# Patient Record
Sex: Female | Born: 1970 | Race: Black or African American | Hispanic: No | Marital: Married | Smoking: Never smoker
Health system: Southern US, Community
[De-identification: ages and names within clinical notes are randomized; demographics above are authoritative.]

## PROBLEM LIST (undated history)

## (undated) DIAGNOSIS — I10 Essential (primary) hypertension: Secondary | ICD-10-CM

## (undated) HISTORY — PX: CHOLECYSTECTOMY: SHX55

---

## 1997-05-12 ENCOUNTER — Inpatient Hospital Stay (HOSPITAL_COMMUNITY): Admission: AD | Admit: 1997-05-12 | Discharge: 1997-05-14 | Payer: Self-pay | Admitting: Obstetrics & Gynecology

## 1997-05-17 ENCOUNTER — Encounter: Admission: RE | Admit: 1997-05-17 | Discharge: 1997-08-15 | Payer: Self-pay | Admitting: Obstetrics & Gynecology

## 1999-07-02 ENCOUNTER — Other Ambulatory Visit: Admission: RE | Admit: 1999-07-02 | Discharge: 1999-07-02 | Payer: Self-pay | Admitting: Obstetrics & Gynecology

## 2000-05-12 ENCOUNTER — Inpatient Hospital Stay (HOSPITAL_COMMUNITY): Admission: AD | Admit: 2000-05-12 | Discharge: 2000-05-14 | Payer: Self-pay | Admitting: Family Medicine

## 2000-05-15 ENCOUNTER — Encounter: Admission: RE | Admit: 2000-05-15 | Discharge: 2000-06-14 | Payer: Self-pay | Admitting: Obstetrics and Gynecology

## 2000-06-15 ENCOUNTER — Encounter: Admission: RE | Admit: 2000-06-15 | Discharge: 2000-07-15 | Payer: Self-pay | Admitting: Obstetrics and Gynecology

## 2000-10-10 ENCOUNTER — Encounter: Admission: RE | Admit: 2000-10-10 | Discharge: 2000-11-09 | Payer: Self-pay | Admitting: Obstetrics and Gynecology

## 2000-11-10 ENCOUNTER — Encounter: Admission: RE | Admit: 2000-11-10 | Discharge: 2000-12-10 | Payer: Self-pay | Admitting: Obstetrics and Gynecology

## 2001-09-18 ENCOUNTER — Other Ambulatory Visit: Admission: RE | Admit: 2001-09-18 | Discharge: 2001-09-18 | Payer: Self-pay | Admitting: Obstetrics and Gynecology

## 2003-02-12 ENCOUNTER — Other Ambulatory Visit: Admission: RE | Admit: 2003-02-12 | Discharge: 2003-02-12 | Payer: Self-pay | Admitting: Obstetrics & Gynecology

## 2004-03-01 ENCOUNTER — Other Ambulatory Visit: Admission: RE | Admit: 2004-03-01 | Discharge: 2004-03-01 | Payer: Self-pay | Admitting: Obstetrics & Gynecology

## 2008-05-14 ENCOUNTER — Ambulatory Visit (HOSPITAL_COMMUNITY): Admission: RE | Admit: 2008-05-14 | Discharge: 2008-05-14 | Payer: Self-pay | Admitting: Internal Medicine

## 2008-08-07 ENCOUNTER — Emergency Department (HOSPITAL_COMMUNITY): Admission: EM | Admit: 2008-08-07 | Discharge: 2008-08-07 | Payer: Self-pay | Admitting: Emergency Medicine

## 2008-08-20 ENCOUNTER — Encounter: Admission: RE | Admit: 2008-08-20 | Discharge: 2008-08-20 | Payer: Self-pay | Admitting: Surgery

## 2009-11-20 ENCOUNTER — Encounter: Admission: RE | Admit: 2009-11-20 | Discharge: 2009-11-20 | Payer: Self-pay | Admitting: Obstetrics & Gynecology

## 2010-04-18 LAB — COMPREHENSIVE METABOLIC PANEL
ALT: 12 U/L (ref 0–35)
AST: 22 U/L (ref 0–37)
Albumin: 3.9 g/dL (ref 3.5–5.2)
Calcium: 9.2 mg/dL (ref 8.4–10.5)
GFR calc Af Amer: >60 mL/min (ref >60)
Sodium: 136 mEq/L (ref 135–145)
Total Protein: 7.5 g/dL (ref 6.0–8.3)

## 2010-04-18 LAB — DIFFERENTIAL
Eosinophils Absolute: 0 10*3/uL (ref 0.0–0.7)
Eosinophils Relative: 0 % (ref 0–5)
Lymphs Abs: 0.9 10*3/uL (ref 0.7–4.0)
Monocytes Relative: 5 % (ref 3–12)
Neutrophils Relative %: 86 % — ABNORMAL HIGH (ref 43–77)

## 2010-04-18 LAB — CBC
MCHC: 33.8 g/dL (ref 30.0–36.0)
Platelets: 274 10*3/uL (ref 150–400)
RDW: 14 % (ref 11.5–15.5)

## 2010-04-18 LAB — URINALYSIS, ROUTINE W REFLEX MICROSCOPIC
Ketones, ur: 40 mg/dL — AB
Nitrite: NEGATIVE
Protein, ur: NEGATIVE mg/dL
pH: 6 (ref 5.0–8.0)

## 2010-08-01 IMAGING — CT CT HEAD WO/W CM
1 of 2 series · 13 of 30 positions shown, 17 images · IV contrast (agent unspecified)
Comparison: None

CLINICAL DATA: Dizziness.  Double vision.  Decreased level of
consciousness.

CT HEAD WITHOUT AND WITH CONTRAST
TECHNIQUE: Contiguous axial images were obtained from the base of
the skull through the vertex without and with intravenous contrast.
Contrast: 100 ml [id number]

[Series 2: brain w/o · axial · non-contrast · 0.47mm/px · z∈[+143,+257]mm · 13 of 26 slices shown, 17 images]
[im 2/26  brain]
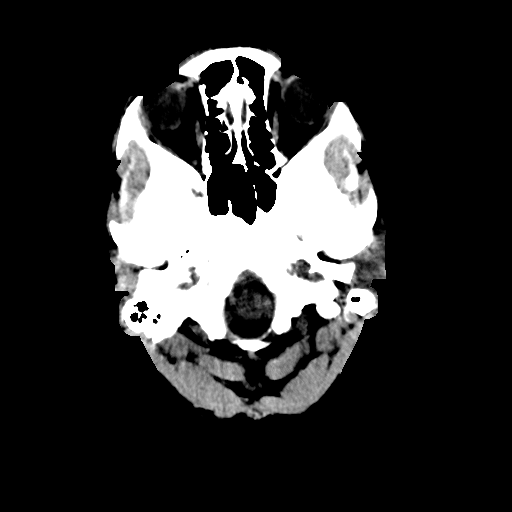
[im 2/26  bone]
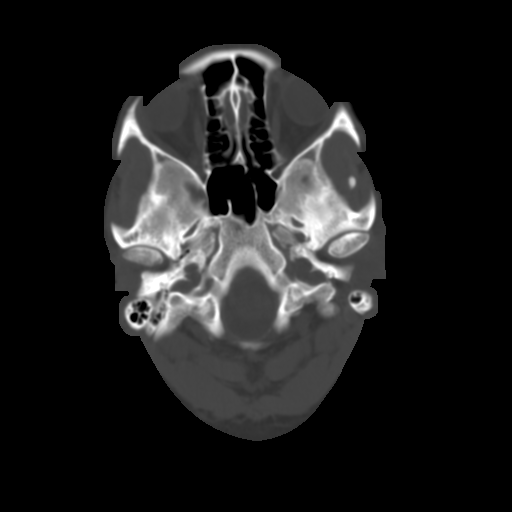
[im 4/26  brain]
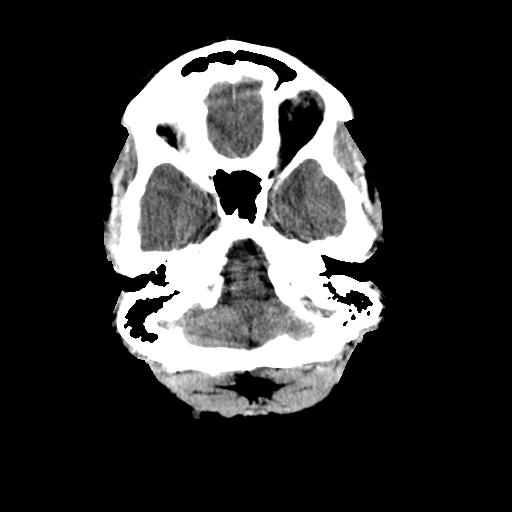
[im 6/26  brain]
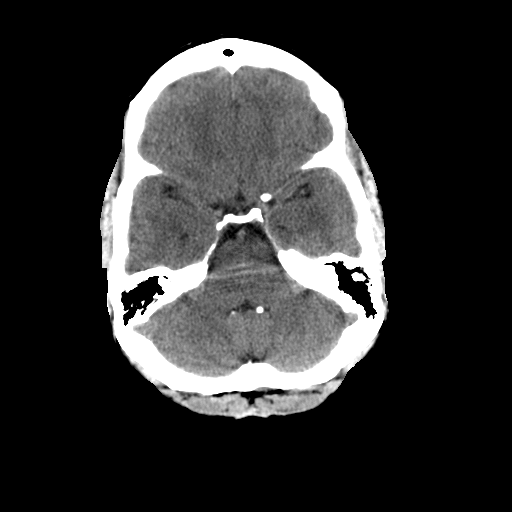
[im 8/26  brain]
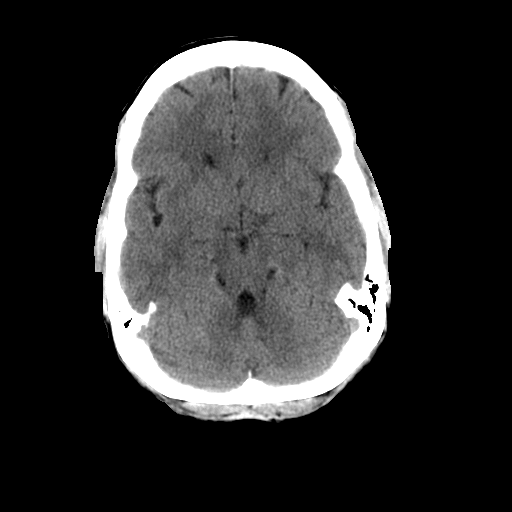
[im 9/26  brain]
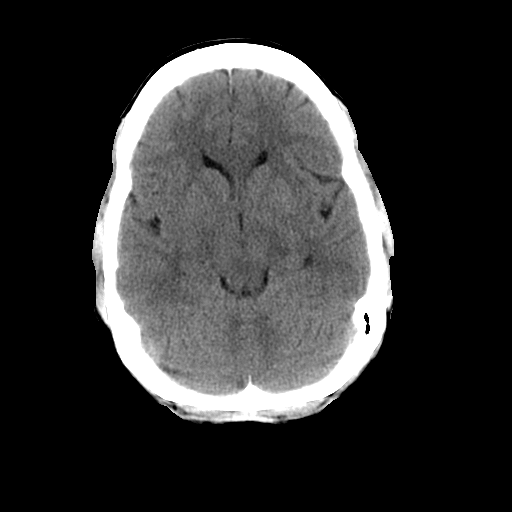
[im 9/26  bone]
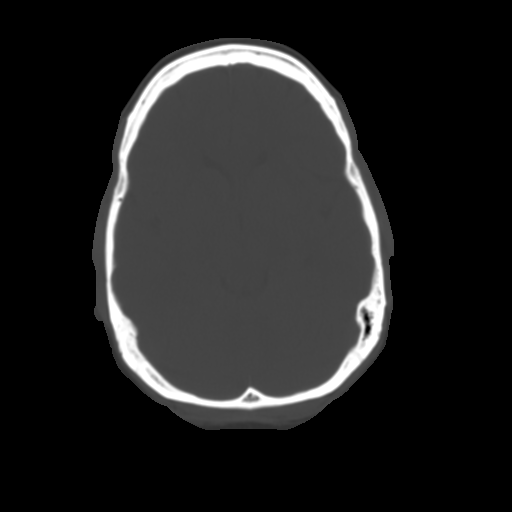
[im 11/26  brain]
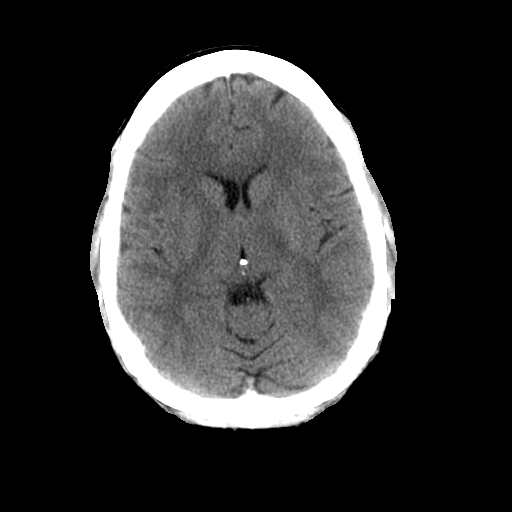
[im 13/26  brain]
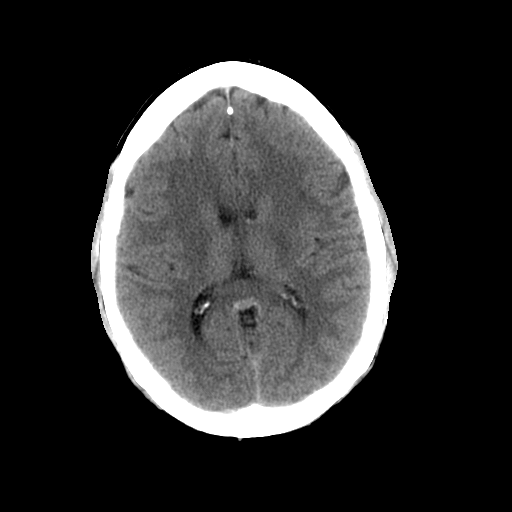
[im 15/26  brain]
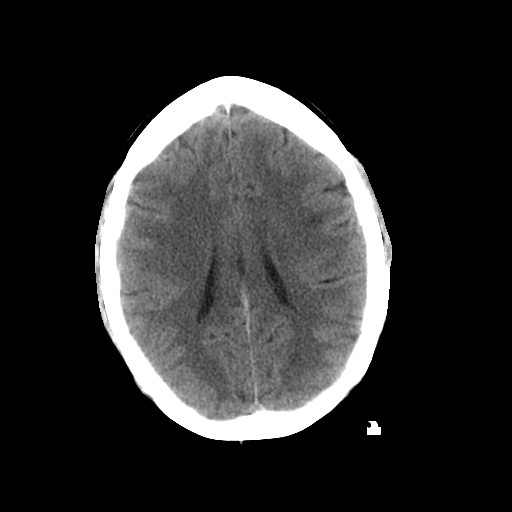
[im 17/26  brain]
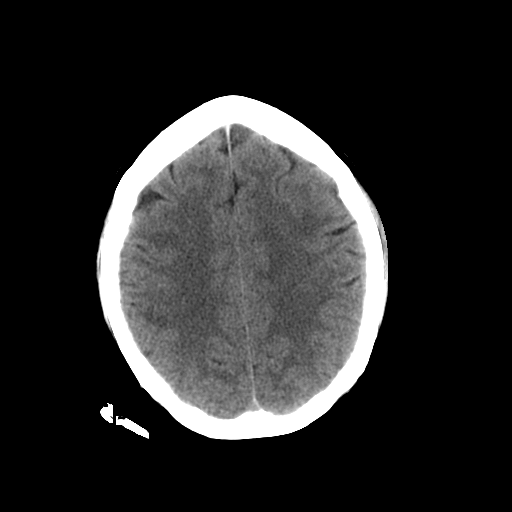
[im 17/26  bone]
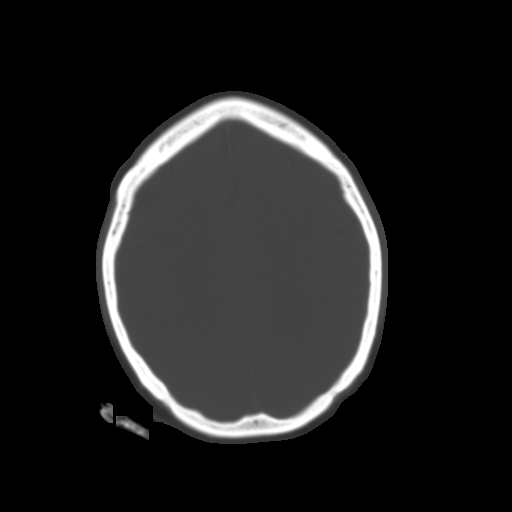
[im 18/26  brain]
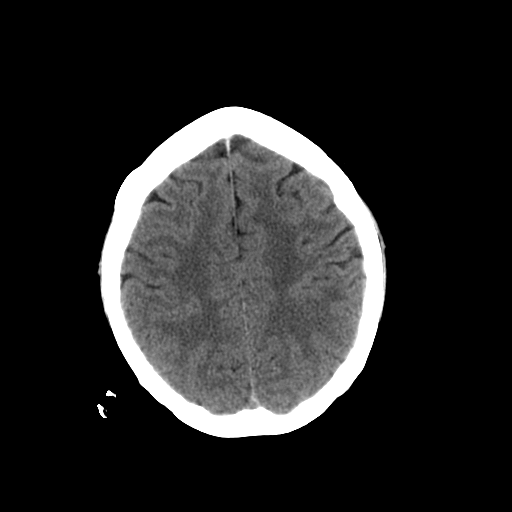
[im 20/26  brain]
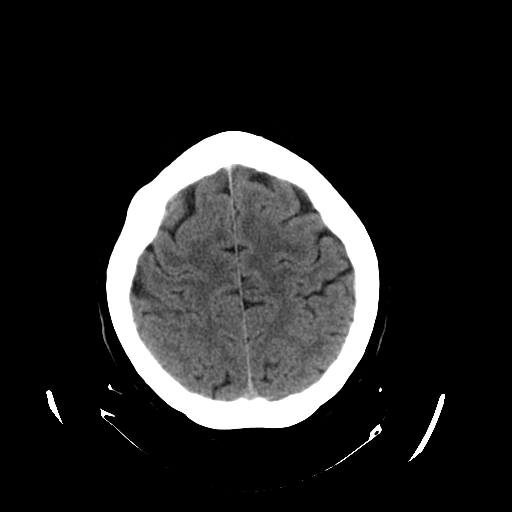
[im 22/26  brain]
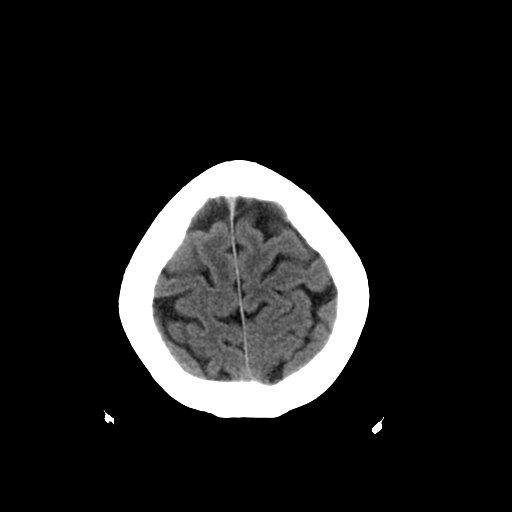
[im 24/26  brain]
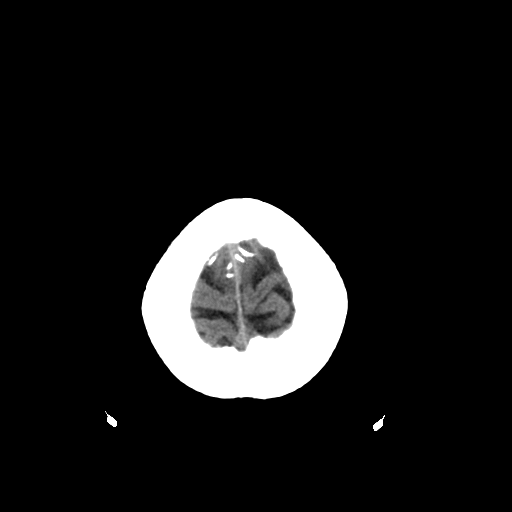
[im 24/26  bone]
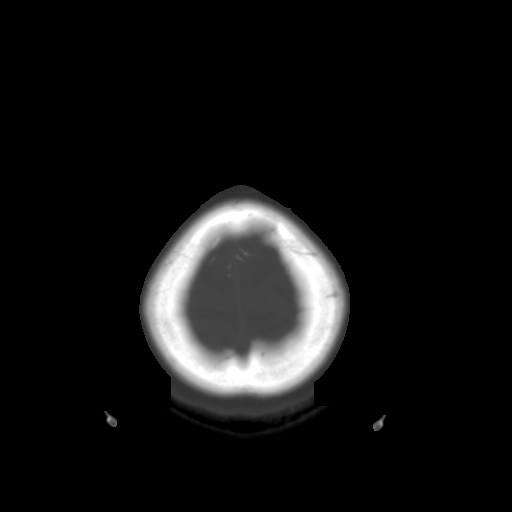

[13 of 30 positions shown; findings below may reference images not displayed]

FINDINGS: The brain has a normal appearance without evidence of
atrophy, old or acute small vessel infarction, mass lesion,
hemorrhage, hydrocephalus or extra-axial collection.  No abnormal
enhancement occurs.  No calvarial abnormality.  The paranasal
sinuses, middle ears and mastoids are clear.
IMPRESSION: Normal examination.  No cause of the patient's described symptoms
is identified.

## 2010-08-29 ENCOUNTER — Inpatient Hospital Stay (INDEPENDENT_AMBULATORY_CARE_PROVIDER_SITE_OTHER)
Admission: RE | Admit: 2010-08-29 | Discharge: 2010-08-29 | Disposition: A | Discharge status: Home / Self Care | Payer: Managed Care, Other (non HMO) | Source: Ambulatory Visit | Attending: Emergency Medicine | Admitting: Emergency Medicine

## 2010-08-29 DIAGNOSIS — B9789 Other viral agents as the cause of diseases classified elsewhere: Secondary | ICD-10-CM

## 2010-08-29 LAB — POCT RAPID STREP A: Streptococcus, Group A Screen (Direct): NEGATIVE

## 2010-10-11 ENCOUNTER — Other Ambulatory Visit: Payer: Self-pay | Admitting: Internal Medicine

## 2010-10-11 DIAGNOSIS — Z1231 Encounter for screening mammogram for malignant neoplasm of breast: Secondary | ICD-10-CM

## 2010-11-22 ENCOUNTER — Ambulatory Visit
Admission: RE | Admit: 2010-11-22 | Discharge: 2010-11-22 | Disposition: A | Discharge status: Home / Self Care | Payer: Managed Care, Other (non HMO) | Source: Ambulatory Visit | Attending: Internal Medicine | Admitting: Internal Medicine

## 2010-11-22 DIAGNOSIS — Z1231 Encounter for screening mammogram for malignant neoplasm of breast: Secondary | ICD-10-CM

## 2011-05-20 ENCOUNTER — Encounter (HOSPITAL_COMMUNITY): Payer: Self-pay | Admitting: *Deleted

## 2011-05-20 ENCOUNTER — Emergency Department (HOSPITAL_COMMUNITY)
Admission: EM | Admit: 2011-05-20 | Discharge: 2011-05-21 | Disposition: A | Discharge status: Home / Self Care | Payer: Managed Care, Other (non HMO) | Attending: Emergency Medicine | Admitting: Emergency Medicine

## 2011-05-20 DIAGNOSIS — M549 Dorsalgia, unspecified: Secondary | ICD-10-CM | POA: Insufficient documentation

## 2011-05-20 DIAGNOSIS — I10 Essential (primary) hypertension: Secondary | ICD-10-CM

## 2011-05-20 DIAGNOSIS — R079 Chest pain, unspecified: Secondary | ICD-10-CM | POA: Insufficient documentation

## 2011-05-20 DIAGNOSIS — R0789 Other chest pain: Secondary | ICD-10-CM

## 2011-05-20 HISTORY — DX: Essential (primary) hypertension: I10

## 2011-05-20 NOTE — ED Notes (Signed)
Pt c/o CP and back pain that increases with inspiration.  Denies n/v, SOB, dizziness, diaphoresis.

## 2011-05-21 ENCOUNTER — Emergency Department (HOSPITAL_COMMUNITY): Payer: Managed Care, Other (non HMO)

## 2011-05-21 LAB — CBC
HCT: 38.2 % (ref 36.0–46.0)
Hemoglobin: 13.1 g/dL (ref 12.0–15.0)
MCH: 27.7 pg (ref 26.0–34.0)
MCHC: 34.3 g/dL (ref 30.0–36.0)
MCV: 80.8 fL (ref 78.0–100.0)
Platelets: 275 10*3/uL (ref 150–400)
RBC: 4.73 MIL/uL (ref 3.87–5.11)
RDW: 13.8 % (ref 11.5–15.5)
WBC: 7.7 10*3/uL (ref 4.0–10.5)

## 2011-05-21 LAB — BASIC METABOLIC PANEL
CO2: 27 mEq/L (ref 19–32)
Chloride: 102 mEq/L (ref 96–112)
Creatinine, Ser: 0.79 mg/dL (ref 0.50–1.10)

## 2011-05-21 LAB — CARDIAC PANEL(CRET KIN+CKTOT+MB+TROPI)
CK, MB: 1.5 ng/mL (ref 0.3–4.0)
Relative Index: 1 (ref 0.0–2.5)
Total CK: 145 U/L (ref 7–177)
Troponin I: <0.3 ng/mL

## 2011-05-21 LAB — POCT I-STAT TROPONIN I: Troponin i, poc: 0 ng/mL (ref 0.00–0.08)

## 2011-05-21 LAB — D-DIMER, QUANTITATIVE: D-Dimer, Quant: 0.33 ug{FEU}/mL (ref 0.00–0.48)

## 2011-05-21 MED ORDER — HYDROCODONE-ACETAMINOPHEN 7.5-500 MG/15ML PO SOLN: 15.0000 mL | Freq: Four times a day (QID) | ORAL | Status: AC | PRN

## 2011-05-21 MED ORDER — MORPHINE SULFATE 4 MG/ML IJ SOLN
4.0000 mg | Freq: Once | INTRAMUSCULAR | Status: DC
  Filled 2011-05-21: qty 1

## 2011-05-21 MED ORDER — HYDROCODONE-ACETAMINOPHEN 7.5-500 MG/15ML PO SOLN
10.0000 mL | Freq: Once | ORAL | Status: AC
  Administered 2011-05-21: 10 mL via ORAL
  Filled 2011-05-21: qty 15

## 2011-05-21 NOTE — Discharge Instructions (Signed)
Your workup today in the emergency department has not shown a specific cause for your pain. Please followup with your primary care Dr. in 2-3 days for recheck. Over the weekend, rest. Return to the emergency room for worsening symptoms or new concerning symptoms.  Chest Pain (Nonspecific) It is often hard to give a specific diagnosis for the cause of chest pain. There is always a chance that your pain could be related to something serious, such as a heart attack or a blood clot in the lungs. You need to follow up with your caregiver for further evaluation. CAUSES   Heartburn.   Pneumonia or bronchitis.   Anxiety or stress.   Inflammation around your heart (pericarditis) or lung (pleuritis or pleurisy).   A blood clot in the lung.   A collapsed lung (pneumothorax). It can develop suddenly on its own (spontaneous pneumothorax) or from injury (trauma) to the chest.   Shingles infection (herpes zoster virus).  The chest wall is composed of bones, muscles, and cartilage. Any of these can be the source of the pain.  The bones can be bruised by injury.   The muscles or cartilage can be strained by coughing or overwork.   The cartilage can be affected by inflammation and become sore (costochondritis).  DIAGNOSIS  Lab tests or other studies, such as X-rays, electrocardiography, stress testing, or cardiac imaging, may be needed to find the cause of your pain.  TREATMENT   Treatment depends on what may be causing your chest pain. Treatment may include:   Acid blockers for heartburn.   Anti-inflammatory medicine.   Pain medicine for inflammatory conditions.   Antibiotics if an infection is present.   You may be advised to change lifestyle habits. This includes stopping smoking and avoiding alcohol, caffeine, and chocolate.   You may be advised to keep your head raised (elevated) when sleeping. This reduces the chance of acid going backward from your stomach into your esophagus.   Most  of the time, nonspecific chest pain will improve within 2 to 3 days with rest and mild pain medicine.  HOME CARE INSTRUCTIONS   If antibiotics were prescribed, take your antibiotics as directed. Finish them even if you start to feel better.   For the next few days, avoid physical activities that bring on chest pain. Continue physical activities as directed.   Do not smoke.   Avoid drinking alcohol.   Only take over-the-counter or prescription medicine for pain, discomfort, or fever as directed by your caregiver.   Follow your caregiver's suggestions for further testing if your chest pain does not go away.   Keep any follow-up appointments you made. If you do not go to an appointment, you could develop lasting (chronic) problems with pain. If there is any problem keeping an appointment, you must call to reschedule.  SEEK MEDICAL CARE IF:   You think you are having problems from the medicine you are taking. Read your medicine instructions carefully.   Your chest pain does not go away, even after treatment.   You develop a rash with blisters on your chest.  SEEK IMMEDIATE MEDICAL CARE IF:   You have increased chest pain or pain that spreads to your arm, neck, jaw, back, or abdomen.   You develop shortness of breath, an increasing cough, or you are coughing up blood.   You have severe back or abdominal pain, feel nauseous, or vomit.   You develop severe weakness, fainting, or chills.   You have a fever.  THIS IS AN EMERGENCY. Do not wait to see if the pain will go away. Get medical help at once. Call your local emergency services (911 in [place] Do not drive yourself to the hospital. MAKE SURE YOU:   Understand these instructions.   Will watch your condition.   Will get help right away if you are not doing well or get worse.  Document Released: 10/06/2004 Document Revised: 12/16/2010 Document Reviewed: 08/02/2007 [facility] Patient Information 2012 [place], [place].  Pain of  Unknown Etiology (Pain Without a Known Cause) You have come to your caregiver because of pain. Pain can occur in any part of the body. Often there is not a definite cause. If your laboratory (blood or urine) work was normal and x-rays or other studies were normal, your caregiver may treat you without knowing the cause of the pain. An example of this is the headache. Most headaches are diagnosed by taking a history. This means your caregiver asks you questions about your headaches. Your caregiver determines a treatment based on your answers. Usually testing done for headaches is normal. Often testing is not done unless there is no response to medications. Regardless of where your pain is located today, you can be given medications to make you comfortable. If no physical cause of pain can be found, most cases of pain will gradually leave as suddenly as they came.  If you have a painful condition and no reason can be found for the pain, It is importantthat you follow up with your caregiver. If the pain becomes worse or does not go away, it may be necessary to repeat tests and look further for a possible cause.  Only take over-the-counter or prescription medicines for pain, discomfort, or fever as directed by your caregiver.   For the protection of your privacy, test results can not be given over the phone. Make sure you receive the results of your test. Ask as to how these results are to be obtained if you have not been informed. It is your responsibility to obtain your test results.   You may continue all activities unless the activities cause more pain. When the pain lessens, it is important to gradually resume normal activities. Resume activities by beginning slowly and gradually increasing the intensity and duration of the activities or exercise. During periods of severe pain, bed-rest may be helpful. Lay or sit in any position that is comfortable.   Ice used for acute (sudden) conditions may be  effective. Use a large plastic bag filled with ice and wrapped in a towel. This may provide pain relief.   See your caregiver for continued problems. They can help or refer you for exercises or physical therapy if necessary.  If you were given medications for your condition, do not drive, operate machinery or power tools, or sign legal documents for 24 hours. Do not drink alcohol, take sleeping pills, or take other medications that may interfere with treatment. See your caregiver immediately if you have pain that is becoming worse and not relieved by medications. Document Released: 09/21/2000 Document Revised: 12/16/2010 Document Reviewed: 12/27/2004 [facility] Patient Information 2012 [place], [place].  How to Take Your Blood Pressure  These instructions are only for electronic home blood pressure machines. You will need:   An automatic or semi-automatic blood pressure machine.   Fresh batteries for the blood pressure machine.  HOW DO I USE THESE TOOLS TO CHECK MY BLOOD PRESSURE?   There are 2 numbers that make up your blood pressure. For  example: 120/80.   The first number (120 in our example) is called the "systolic pressure." It is a measure of the pressure in your blood vessels when your heart is pumping blood.   The second number (80 in our example) is called the "diastolic pressure." It is a measure of the pressure in your blood vessels when your heart is resting between beats.   Before you buy a home blood pressure machine, check the size of your arm so you can buy the right size cuff. Here is how to check the size of your arm:   Use a tape measure that shows both inches and centimeters.   Wrap the tape measure around the middle upper part of your arm. You may need someone to help you measure right.   Write down your arm measurement in both inches and centimeters.   To measure your blood pressure right, it is important to have the right size cuff.   If your arm is up to 13 inches  (37 to 34 centimeters), get an adult cuff size.   If your arm is 13 to 17 inches (35 to 44 centimeters), get a large adult cuff size.   If your arm is 17 to 20 inches (45 to 52 centimeters), get an adult thigh cuff.   Try to rest or relax for at least 30 minutes before you check your blood pressure.   Do not smoke.   Do not have any drinks with caffeine, such as:   Pop.   Coffee.   Tea.   Check your blood pressure in a quiet room.   Sit down and stretch out your arm on a table. Keep your arm at about the level of your heart. Let your arm relax.  GETTING BLOOD PRESSURE READINGS  Make sure you remove any tight-fighting clothing from your arm. Wrap the cuff around your upper arm. Wrap it just above the bend, and above where you felt the pulse. You should be able to slip a finger between the cuff and your arm. If you cannot slip a finger in the cuff, it is too tight and should be removed and rewrapped.   Some units requires you to manually pump up the arm cuff.   Automatic units inflate the cuff when you press a button.   Cuff deflation is automatic in both models.   After the cuff is inflated, the unit measures your blood pressure and pulse. The readings are displayed on a monitor. Hold still and breathe normally while the cuff is inflated.   Getting a reading takes less than a minute.   Some models store readings in a memory. Some provide a printout of readings.   Get readings at different times of the day. You should wait at least 5 minutes between readings. Take readings with you to your next doctor's visit.  Document Released: 12/10/2007 Document Revised: 12/16/2010 Document Reviewed: 12/10/2007 [facility] Patient Information 2012 [place], [place].

## 2011-05-21 NOTE — ED Notes (Signed)
Patient with chest pain, started earlier today, does have some shortness of breath with exertion.  Patient denies any nausea or vomiting at this time.

## 2011-05-23 NOTE — ED Provider Notes (Signed)
History     CSN: [id number]  Arrival date & time 05/20/11  2210   First MD Initiated Contact with Patient 05/21/11 0136      Chief Complaint  Patient presents with  . Chest Pain    (Consider location/radiation/quality/duration/timing/severity/associated sxs/prior treatment) HPI 41 year old female presents emergency department with complaint of chest pain and back pain. Patient reports onset of pain today after going to a baseball game. Patient describes the pain as a tightness and tenderness, and "feeling like gas". Pain is worse with movement and with deep breathing. Patient has noted that when she coughs or sneezes the pain is worse. Patient denies nausea vomiting diaphoresis leg swelling. Patient is not on birth control. She is not a smoker. She has no family or personal history of PE or DVT. No family history of coronary disease. Patient with history of hypertension, but does not take any medication for it   Past Medical History  Diagnosis Date  . Hypertension     Past Surgical History  Procedure Date  . Cholecystectomy     History reviewed. No pertinent family history.  History  Substance Use Topics  . Smoking status: Never Smoker   . Smokeless tobacco: Not on file  . Alcohol Use: No    OB History    Grav Para Term Preterm Abortions TAB SAB Ect Mult Living                  Review of Systems  All other systems reviewed and are negative.    Allergies  Review of patient's allergies indicates no known allergies.  Home Medications   Current Outpatient Rx  Name Route Sig Dispense Refill  . CALCIUM CARBONATE ANTACID 500 MG PO CHEW Oral Chew 1 tablet by mouth daily.    Marland Kitchen OMEPRAZOLE 20 MG PO CPDR Oral Take 20 mg by mouth daily.    Marland Kitchen HYDROCODONE-ACETAMINOPHEN 7.5-500 MG/15ML PO SOLN Oral Take 15 mLs by mouth every 6 (six) hours as needed for pain. 120 mL 0    BP 155/99  Pulse 77  Temp(Src) 98.4 F (36.9 C) (Oral)  Resp 18  SpO2 100%  LMP  04/15/2011  Physical Exam  Nursing note and vitals reviewed. Constitutional: She is oriented to person, place, and time. She appears well-developed and well-nourished.  HENT:  Head: Normocephalic and atraumatic.  Nose: Nose normal.  Mouth/Throat: Oropharynx is clear and moist.  Eyes: Conjunctivae and EOM are normal. Pupils are equal, round, and reactive to light.  Neck: Normal range of motion. Neck supple. No JVD present. No tracheal deviation present. No thyromegaly present.  Cardiovascular: Normal rate, regular rhythm, normal heart sounds and intact distal pulses.  Exam reveals no gallop and no friction rub.   No murmur heard. Pulmonary/Chest: Effort normal and breath sounds normal. No stridor. No respiratory distress. She has no wheezes. She has no rales. She exhibits tenderness (patient with diffuse tenderness to palpation across her chest).  Abdominal: Soft. Bowel sounds are normal. She exhibits no distension and no mass. There is no tenderness. There is no rebound and no guarding.  Musculoskeletal: Normal range of motion. She exhibits no edema and no tenderness.  Lymphadenopathy:    She has no cervical adenopathy.  Neurological: She is oriented to person, place, and time. She exhibits normal muscle tone. Coordination normal.  Skin: Skin is dry. No rash noted. No erythema. No pallor.  Psychiatric: She has a normal mood and affect. Her behavior is normal. Judgment and thought content normal.  ED Course  Procedures (including critical care time)   Date: 05/20/2011  Rate:71  Rhythm: normal sinus rhythm  QRS Axis: normal  Intervals: normal  ST/T Wave abnormalities: normal  Conduction Disutrbances:none  Narrative Interpretation:   Old EKG Reviewed: none available  Results for orders placed during the hospital encounter of 05/20/11  POCT I-STAT TROPONIN I      Component Value Range   Troponin i, poc 0.00  0.00 - 0.08 (ng/mL)   Comment 3           CBC      Component Value  Range   WBC 7.7  4.0 - 10.5 (K/uL)   RBC 4.73  3.87 - 5.11 (MIL/uL)   Hemoglobin 13.1  12.0 - 15.0 (g/dL)   HCT 29.5  28.4 - 13.2 (%)   MCV 80.8  78.0 - 100.0 (fL)   MCH 27.7  26.0 - 34.0 (pg)   MCHC 34.3  30.0 - 36.0 (g/dL)   RDW 44.0  10.2 - 72.5 (%)   Platelets 275  150 - 400 (K/uL)  BASIC METABOLIC PANEL      Component Value Range   Sodium 140  135 - 145 (mEq/L)   Potassium 3.6  3.5 - 5.1 (mEq/L)   Chloride 102  96 - 112 (mEq/L)   CO2 27  19 - 32 (mEq/L)   Glucose, Bld 93  70 - 99 (mg/dL)   BUN 9  6 - 23 (mg/dL)   Creatinine, Ser 3.66  0.50 - 1.10 (mg/dL)   Calcium 9.4  8.4 - 44.0 (mg/dL)   GFR calc non Af Amer >90  >90 (mL/min)   GFR calc Af Amer >90  >90 (mL/min)  D-DIMER, QUANTITATIVE      Component Value Range   D-Dimer, Quant 0.33  0.00 - 0.48 (ug/mL-FEU)  CARDIAC PANEL(CRET KIN+CKTOT+MB+TROPI)      Component Value Range   Total CK 145  7 - 177 (U/L)   CK, MB 1.5  0.3 - 4.0 (ng/mL)   Troponin I <0.30  <0.30 (ng/mL)   Relative Index 1.0  0.0 - 2.5    Results for orders placed during the hospital encounter of 05/20/11  POCT I-STAT TROPONIN I      Component Value Range   Troponin i, poc 0.00  0.00 - 0.08 (ng/mL)   Comment 3           CBC      Component Value Range   WBC 7.7  4.0 - 10.5 (K/uL)   RBC 4.73  3.87 - 5.11 (MIL/uL)   Hemoglobin 13.1  12.0 - 15.0 (g/dL)   HCT 34.7  42.5 - 95.6 (%)   MCV 80.8  78.0 - 100.0 (fL)   MCH 27.7  26.0 - 34.0 (pg)   MCHC 34.3  30.0 - 36.0 (g/dL)   RDW 38.7  56.4 - 33.2 (%)   Platelets 275  150 - 400 (K/uL)  BASIC METABOLIC PANEL      Component Value Range   Sodium 140  135 - 145 (mEq/L)   Potassium 3.6  3.5 - 5.1 (mEq/L)   Chloride 102  96 - 112 (mEq/L)   CO2 27  19 - 32 (mEq/L)   Glucose, Bld 93  70 - 99 (mg/dL)   BUN 9  6 - 23 (mg/dL)   Creatinine, Ser 9.51  0.50 - 1.10 (mg/dL)   Calcium 9.4  8.4 - 88.4 (mg/dL)   GFR calc non Af Amer >90  >90 (mL/min)  GFR calc Af Amer >90  >90 (mL/min)  D-DIMER, QUANTITATIVE       Component Value Range   D-Dimer, Quant 0.33  0.00 - 0.48 (ug/mL-FEU)  CARDIAC PANEL(CRET KIN+CKTOT+MB+TROPI)      Component Value Range   Total CK 145  7 - 177 (U/L)   CK, MB 1.5  0.3 - 4.0 (ng/mL)   Troponin I <0.30  <0.30 (ng/mL)   Relative Index 1.0  0.0 - 2.5    Dg Chest 2 View  05/21/2011  *RADIOLOGY REPORT*  Clinical Data: Chest pain since yesterday.  CHEST - 2 VIEW  Comparison: 08/20/2008  Findings: Slightly shallow inspiration.  Normal heart size and pulmonary vascularity.  No focal airspace consolidation in the lungs.  No blunting of costophrenic angles.  No pneumothorax. Surgical clips in the right upper quadrant.  No acute changes since the previous study.  IMPRESSION: No evidence of active pulmonary disease.  Original Report Authenticated By: Marlon Pel, M.D.      1. Chest pain, atypical   2. Hypertension       MDM  41 year old female with chest pain. Workup unremarkable. Pain seems more musculoskeletal in origin, do not feel as do 2 ACS or PE or other life-threatening cause. Patient has local. Primary care doctor, will have her followup with them this week        Olivia Mackie, MD 05/23/11 [phone]

## 2013-03-29 ENCOUNTER — Other Ambulatory Visit: Payer: Self-pay

## 2013-03-29 DIAGNOSIS — Z1231 Encounter for screening mammogram for malignant neoplasm of breast: Secondary | ICD-10-CM

## 2013-04-12 ENCOUNTER — Ambulatory Visit
Admission: RE | Admit: 2013-04-12 | Discharge: 2013-04-12 | Disposition: A | Discharge status: Home / Self Care | Payer: Managed Care, Other (non HMO) | Source: Ambulatory Visit

## 2013-04-12 DIAGNOSIS — Z1231 Encounter for screening mammogram for malignant neoplasm of breast: Secondary | ICD-10-CM

## 2016-03-25 ENCOUNTER — Encounter (HOSPITAL_COMMUNITY): Payer: Self-pay

## 2016-03-25 ENCOUNTER — Emergency Department (HOSPITAL_COMMUNITY)
Admission: EM | Admit: 2016-03-25 | Discharge: 2016-03-25 | Disposition: A | Discharge status: Home / Self Care | Payer: BLUE CROSS/BLUE SHIELD | Attending: Emergency Medicine | Admitting: Emergency Medicine

## 2016-03-25 ENCOUNTER — Emergency Department (HOSPITAL_COMMUNITY): Payer: BLUE CROSS/BLUE SHIELD

## 2016-03-25 DIAGNOSIS — R0789 Other chest pain: Secondary | ICD-10-CM | POA: Diagnosis not present

## 2016-03-25 DIAGNOSIS — I10 Essential (primary) hypertension: Secondary | ICD-10-CM | POA: Insufficient documentation

## 2016-03-25 DIAGNOSIS — R079 Chest pain, unspecified: Secondary | ICD-10-CM | POA: Diagnosis present

## 2016-03-25 LAB — BASIC METABOLIC PANEL
Anion gap: 10 (ref 5–15)
BUN: 13 mg/dL (ref 6–20)
CALCIUM: 8.8 mg/dL — AB (ref 8.9–10.3)
CO2: 24 mmol/L (ref 22–32)
Chloride: 107 mmol/L (ref 101–111)
Creatinine, Ser: 0.75 mg/dL (ref 0.44–1.00)
GFR calc Af Amer: >60 mL/min (ref >60)
Glucose, Bld: 102 mg/dL — ABNORMAL HIGH (ref 65–99)
POTASSIUM: 3.7 mmol/L (ref 3.5–5.1)
SODIUM: 141 mmol/L (ref 135–145)

## 2016-03-25 LAB — I-STAT TROPONIN, ED
TROPONIN I, POC: 0 ng/mL (ref 0.00–0.08)
TROPONIN I, POC: 0 ng/mL (ref 0.00–0.08)

## 2016-03-25 LAB — CBC
HEMATOCRIT: 36.3 % (ref 36.0–46.0)
Hemoglobin: 11.6 g/dL — ABNORMAL LOW (ref 12.0–15.0)
MCH: 26.4 pg (ref 26.0–34.0)
MCHC: 32 g/dL (ref 30.0–36.0)
MCV: 82.7 fL (ref 78.0–100.0)
PLATELETS: 290 10*3/uL (ref 150–400)
RBC: 4.39 MIL/uL (ref 3.87–5.11)
RDW: 15.1 % (ref 11.5–15.5)
WBC: 6.1 10*3/uL (ref 4.0–10.5)

## 2016-03-25 MED ORDER — KETOROLAC TROMETHAMINE 60 MG/2ML IM SOLN
60.0000 mg | Freq: Once | INTRAMUSCULAR | Status: AC
  Administered 2016-03-25: 60 mg via INTRAMUSCULAR
  Filled 2016-03-25: qty 2

## 2016-03-25 NOTE — ED Provider Notes (Signed)
[facility]-EMERGENCY DEPT Provider Note   CSN: [id number] Arrival date & time: 03/25/16  [id number]     History   Chief Complaint Chief Complaint  Patient presents with  . Chest Pain  . Shortness of Breath    HPI Vanessa Dickerson is a 46 y.o. female.  Patient is a 46 year old female with a history of hypertension who presents with chest pain. She states that 2 days ago she had some pains which she felt were gas pains in the left side of her chest and today she woke up with pain in her right chest. She feels like it's an achy-like pain. She says it hurts when she moves around. Hurts a little bit when she breathes but denies any shortness of breath. No nausea or vomiting. No cough or chest congestion. No injury to the chest. She denies any leg pain or swelling. No fevers. No prior history of heart disease. No family history of heart disease. Denies any smoking history. No recent immobilization. No exogenous estrogen use      Past Medical History:  Diagnosis Date  . Hypertension     There are no active problems to display for this patient.   Past Surgical History:  Procedure Laterality Date  . CHOLECYSTECTOMY      OB History    No data available       Home Medications    Prior to Admission medications   Medication Sig Start Date End Date Taking? Authorizing Provider  amLODipine-olmesartan (AZOR) 5-20 MG tablet Take 1 tablet by mouth daily.   Yes Historical Provider, MD    Family History No family history on file.  Social History Social History  Substance Use Topics  . Smoking status: Never Smoker  . Smokeless tobacco: Never Used  . Alcohol use No     Allergies   Patient has no known allergies.   Review of Systems Review of Systems  Constitutional: Negative for chills, diaphoresis, fatigue and fever.  HENT: Negative for congestion, rhinorrhea and sneezing.   Eyes: Negative.   Respiratory: Negative for cough, chest tightness and shortness of breath.     Cardiovascular: Positive for chest pain. Negative for leg swelling.  Gastrointestinal: Negative for abdominal pain, blood in stool, diarrhea, nausea and vomiting.  Genitourinary: Negative for difficulty urinating, flank pain, frequency and hematuria.  Musculoskeletal: Negative for arthralgias and back pain.  Skin: Negative for rash.  Neurological: Negative for dizziness, speech difficulty, weakness, numbness and headaches.     Physical Exam Updated Vital Signs BP (!) 147/92   Pulse 61   Temp 97.9 F (36.6 C) (Oral)   Resp 14   Ht 5\' 2"  (1.575 m)   Wt 185 lb (83.9 kg)   LMP 03/11/2016   SpO2 100%   BMI 33.84 kg/m   Physical Exam  Constitutional: She is oriented to person, place, and time. She appears well-developed and well-nourished.  HENT:  Head: Normocephalic and atraumatic.  Eyes: Pupils are equal, round, and reactive to light.  Neck: Normal range of motion. Neck supple.  Cardiovascular: Normal rate, regular rhythm and normal heart sounds.   Pulmonary/Chest: Effort normal and breath sounds normal. No respiratory distress. She has no wheezes. She has no rales. She exhibits tenderness (Positive reproducible tenderness to the right chest wall).  Abdominal: Soft. Bowel sounds are normal. There is no tenderness. There is no rebound and no guarding.  Musculoskeletal: Normal range of motion. She exhibits no edema.  No edema or calf tenderness  Lymphadenopathy:  She has no cervical adenopathy.  Neurological: She is alert and oriented to person, place, and time.  Skin: Skin is warm and dry. No rash noted.  Psychiatric: She has a normal mood and affect.     ED Treatments / Results  Labs (all labs ordered are listed, but only abnormal results are displayed) Labs Reviewed  BASIC METABOLIC PANEL - Abnormal; Notable for the following:       Result Value   Glucose, Bld 102 (*)    Calcium 8.8 (*)    All other components within normal limits  CBC - Abnormal; Notable for the  following:    Hemoglobin 11.6 (*)    All other components within normal limits  I-STAT TROPOININ, ED  I-STAT TROPOININ, ED    EKG  EKG Interpretation  Date/Time:  Friday March 25 2016 06:20:07 EDT Ventricular Rate:  81 PR Interval:  130 QRS Duration: 84 QT Interval:  356 QTC Calculation: 413 R Axis:   48 Text Interpretation:  Normal sinus rhythm Cannot rule out Anterior infarct , age undetermined Abnormal ECG since last tracing no significant change Confirmed by Reid Nawrot  MD, Ronnie Mallette ([id number]) on 03/25/2016 7:22:30 AM       Radiology Dg Chest 2 View  Result Date: 03/25/2016 CLINICAL DATA:  Right-sided chest pain extending to the back for 2 days. Headache. Nonsmoker. EXAM: CHEST  2 VIEW COMPARISON:  05/21/2011 FINDINGS: Slightly shallow inspiration. Normal heart size and pulmonary vascularity. No focal airspace disease or consolidation in the lungs. No blunting of costophrenic angles. No pneumothorax. Mediastinal contours appear intact. Surgical clips in the right upper quadrant. IMPRESSION: No active cardiopulmonary disease. Electronically Signed   By: Burman Nieves M.D.   On: 03/25/2016 06:53    Procedures Procedures (including critical care time)  Medications Ordered in ED Medications  ketorolac (TORADOL) injection 60 mg (60 mg Intramuscular Given 03/25/16 0804)     Initial Impression / Assessment and Plan / ED Course  I have reviewed the triage vital signs and the nursing notes.  Pertinent labs & imaging results that were available during my care of the patient were reviewed by me and considered in my medical decision making (see chart for details).     Patient presents with chest pain. It's right sided and reproducible on palpation. She doesn't have any suggestions of PE/DVT. There is no evidence of pneumonia or pneumothorax. There is no ischemic changes on EKG. She's had delta troponin which is negative. Her heart score is 2. She got a dose of Toradol in her pain is much  better. She was discharged home in good condition. She was encouraged to have follow-up with her PCP. Return precautions were given.  Final Clinical Impressions(s) / ED Diagnoses   Final diagnoses:  Atypical chest pain    New Prescriptions New Prescriptions   No medications on file     Rolan Bucco, MD 03/25/16 1010

## 2016-03-25 NOTE — ED Triage Notes (Signed)
Pt woke up this morning then began haviing RIGHT sided chest pain that radiates to back with some SOB. Pt reports in triage she feels it may be trapped gas because it has moved around but wanted to get checked out.  Denies diaphoresis, N/V. NAD. VSS

## 2019-12-12 ENCOUNTER — Other Ambulatory Visit: Payer: Self-pay | Admitting: Internal Medicine

## 2019-12-12 ENCOUNTER — Other Ambulatory Visit: Payer: Self-pay

## 2019-12-12 DIAGNOSIS — Z1231 Encounter for screening mammogram for malignant neoplasm of breast: Secondary | ICD-10-CM

## 2020-01-28 ENCOUNTER — Ambulatory Visit: Payer: Managed Care, Other (non HMO)

## 2020-02-17 ENCOUNTER — Other Ambulatory Visit: Payer: Self-pay

## 2020-02-17 ENCOUNTER — Ambulatory Visit
Admission: RE | Admit: 2020-02-17 | Discharge: 2020-02-17 | Disposition: A | Discharge status: Home / Self Care | Payer: Managed Care, Other (non HMO) | Source: Ambulatory Visit | Attending: Internal Medicine | Admitting: Internal Medicine

## 2020-02-17 DIAGNOSIS — Z1231 Encounter for screening mammogram for malignant neoplasm of breast: Secondary | ICD-10-CM

## 2021-01-26 ENCOUNTER — Other Ambulatory Visit: Payer: Self-pay | Admitting: Internal Medicine

## 2021-01-26 DIAGNOSIS — Z1231 Encounter for screening mammogram for malignant neoplasm of breast: Secondary | ICD-10-CM

## 2021-02-16 ENCOUNTER — Ambulatory Visit
Admission: RE | Admit: 2021-02-16 | Discharge: 2021-02-16 | Disposition: A | Discharge status: Home / Self Care | Payer: BC Managed Care – PPO | Source: Ambulatory Visit | Attending: Internal Medicine | Admitting: Internal Medicine

## 2021-02-16 DIAGNOSIS — Z1231 Encounter for screening mammogram for malignant neoplasm of breast: Secondary | ICD-10-CM

## 2021-02-17 ENCOUNTER — Ambulatory Visit
Admission: RE | Admit: 2021-02-17 | Discharge: 2021-02-17 | Disposition: A | Discharge status: Home / Self Care | Payer: BC Managed Care – PPO | Source: Ambulatory Visit | Attending: Internal Medicine | Admitting: Internal Medicine

## 2021-06-15 ENCOUNTER — Telehealth: Payer: Self-pay | Admitting: Genetic Counselor

## 2021-06-15 NOTE — Telephone Encounter (Signed)
Scheduled appt per 6/6 referral. Pt is aware of appt date and time. Pt is aware to arrive 15 mins prior to appt time and to bring and updated insurance card. Pt is aware of appt location.   

## 2021-07-09 ENCOUNTER — Other Ambulatory Visit: Payer: Self-pay | Admitting: Obstetrics and Gynecology

## 2021-07-09 DIAGNOSIS — R922 Inconclusive mammogram: Secondary | ICD-10-CM

## 2021-07-09 DIAGNOSIS — Z803 Family history of malignant neoplasm of breast: Secondary | ICD-10-CM

## 2021-07-20 ENCOUNTER — Encounter: Payer: Self-pay | Admitting: Genetic Counselor

## 2021-07-20 ENCOUNTER — Other Ambulatory Visit: Payer: Self-pay

## 2021-07-20 ENCOUNTER — Inpatient Hospital Stay: Payer: BC Managed Care – PPO | Attending: Obstetrics and Gynecology | Admitting: Genetic Counselor

## 2021-07-20 ENCOUNTER — Inpatient Hospital Stay: Payer: BC Managed Care – PPO

## 2021-07-20 DIAGNOSIS — Z803 Family history of malignant neoplasm of breast: Secondary | ICD-10-CM

## 2021-07-20 DIAGNOSIS — Z800 Family history of malignant neoplasm of digestive organs: Secondary | ICD-10-CM | POA: Insufficient documentation

## 2021-07-20 NOTE — Progress Notes (Signed)
REFERRING PROVIDER: Maxie Better, MD [address] [place],  [place] [address]  PRIMARY PROVIDER:  Laqueta Due., MD  PRIMARY REASON FOR VISIT:  1. Family history of breast cancer   2. Family history of pancreatic cancer     HISTORY OF PRESENT ILLNESS:   Ms. Ackroyd, a 51 y.o. female, was seen for a [place] cancer genetics consultation at the request of Dr. Cherly Hensen to discuss her genetic test results.   Ms. Lutchman is a 51 y.o. female with no personal history of cancer.    RISK FACTORS:  Menarche was at age 79.  First live birth at age 87.  OCP use for approximately 5 years.  Ovaries intact: yes.  Uterus intact: yes.  Menopausal status: premenopausal.  HRT use: 0 years. Colonoscopy: yes; normal. Mammogram within the last year: yes. Any excessive radiation exposure in the past: no  Past Medical History:  Diagnosis Date   Hypertension     Past Surgical History:  Procedure Laterality Date   CHOLECYSTECTOMY      Social History   Socioeconomic History   Marital status: Married    Spouse name: Not on file   Number of children: Not on file   Years of education: Not on file   Highest education level: Not on file  Occupational History   Not on file  Tobacco Use   Smoking status: Never   Smokeless tobacco: Never  Substance and Sexual Activity   Alcohol use: No   Drug use: Not on file   Sexual activity: Not on file  Other Topics Concern   Not on file  Social History Narrative   Not on file   Social Determinants of Health   Financial Resource Strain: Not on file  Food Insecurity: Not on file  Transportation Needs: Not on file  Physical Activity: Not on file  Stress: Not on file  Social Connections: Not on file     FAMILY HISTORY:  We obtained a detailed, 4-generation family history.  Significant diagnoses are listed below: Family History  Problem Relation Age of Onset   Breast cancer Mother 17   Breast cancer Sister 35       recur  @ age 63, reportedly BRCA1/2 Negative   Prostate cancer Maternal Uncle    Prostate cancer Maternal Uncle    Prostate cancer Maternal Uncle    Pancreatic cancer Maternal Grandmother 35   Breast cancer Cousin 55     There is no reported Ashkenazi Jewish ancestry.   GENETIC TEST RESULTS:  The [facility] Multi-Cancer Panel found no pathogenic mutations.   The Multi-Cancer + RNA Panel offered by [facility] includes sequencing and/or deletion/duplication analysis of the following 84 genes:  AIP*, ALK, APC*, ATM*, AXIN2*, BAP1*, BARD1*, BLM*, BMPR1A*, BRCA1*, BRCA2*, BRIP1*, CASR, CDC73*, CDH1*, CDK4, CDKN1B*, CDKN1C*, CDKN2A, CEBPA, CHEK2*, CTNNA1*, DICER1*, DIS3L2*, EGFR, EPCAM, FH*, FLCN*, GATA2*, GPC3, GREM1, HOXB13, HRAS, KIT, MAX*, MEN1*, MET, MITF, MLH1*, MSH2*, MSH3*, MSH6*, MUTYH*, NBN*, NF1*, NF2*, NTHL1*, PALB2*, PDGFRA, PHOX2B, PMS2*, POLD1*, POLE*, POT1*, PRKAR1A*, PTCH1*, PTEN*, RAD50*, RAD51C*, RAD51D*, RB1*, RECQL4, RET, RUNX1*, SDHA*, SDHAF2*, SDHB*, SDHC*, SDHD*, SMAD4*, SMARCA4*, SMARCB1*, SMARCE1*, STK11*, SUFU*, TERC, TERT, TMEM127*, Tp53*, TSC1*, TSC2*, VHL*, WRN*, and WT1.   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  At this time, it is unknown if this variant is associated with an increased risk for cancer or if it is benign, but most uncertain variants are reclassified to benign. It  should not be used to make medical management decisions. With time, we suspect the laboratory will determine the significance of this variant, if any. If the laboratory reclassifies this variant, we will attempt to contact Ms. Mcghee to discuss it further.       Even though a pathogenic variant was not identified, possible explanations for the cancer in the family may include: There may be no hereditary risk for cancer in the family. The cancers in her family may be due to other genetic or environmental factors. There may be a gene mutation  in one of these genes that current testing methods cannot detect, but that chance is small. There could be another gene that has not yet been discovered, or that we have not yet tested, that is responsible for the cancer diagnoses in the family.  It is also possible there is a hereditary cause for the cancer in the family that Ms. Eltz did not inherit.  Therefore, it is important to remain in touch with cancer genetics in the future so that we can continue to offer Ms. Boughner the most up to date genetic testing.   ADDITIONAL GENETIC TESTING:  We discussed with Ms. Ciliberti that her genetic testing was fairly extensive.  If there are genes identified to increase cancer risk that can be analyzed in the future, we would be happy to discuss and coordinate this testing at that time.     CANCER SCREENING RECOMMENDATIONS:  Ms. Hoerner test result is considered negative (normal).  This means that we have not identified a hereditary cause for her family history of cancer at this time.   An individual's cancer risk and medical management are not determined by genetic test results alone. Overall cancer risk assessment incorporates additional factors, including personal medical history, family history, and any available genetic information that may result in a personalized plan for cancer prevention and surveillance. Therefore, it is recommended she continue to follow the cancer management and screening guidelines provided by her healthcare providers.  Based on the reported personal and family history, specific cancer screenings for Ms. Jacklyn Shell and her family include:  Breast Cancer Screening:  The Tyrer-Cuzick model is one of multiple prediction models developed to estimate an individual's lifetime risk of developing breast cancer. The Tyrer-Cuzick model is endorsed by the [employer] (NCCN). This model includes many risk factors such as family history, endogenous  estrogen exposure, and benign breast disease. The calculation is highly-dependent on the accuracy of clinical data provided by the patient and can change over time. The Tyrer-Cuzick model may be repeated to reflect new information in her personal or family history in the future.   Ms. Thorson Tyrer-Cuzick risk score is 24.0%.  For women with a greater than 20% lifetime risk of breast cancer, the NCCN recommends the following:    1.   Clinical encounter every 6-12 months to begin when identified as being at increased risk, but not before age 30    2.   Annual mammograms, tomosynthesis is recommended starting 10 years earlier than the youngest breast cancer diagnosis in the family or at age 89 (whichever comes first), but not before age 65     73.   Annual breast MRI starting 10 years earlier than the youngest breast cancer diagnosis in the family or at age 42 (whichever comes first), but not before age 26  RECOMMENDATIONS FOR FAMILY MEMBERS:   Since she did not inherit a mutation in a cancer predisposition gene included  on this panel, her children could not have inherited a mutation from her in one of these genes. Individuals in this family might be at some increased risk of developing cancer, over the general population risk, due to the family history of cancer. We recommend women in this family have a yearly mammogram beginning at age 24, or 42 years younger than the earliest onset of breast cancer. Other members of the family may still carry a pathogenic variant in one of these genes that Ms. Curtsinger did not inherit. Based on the family history, we recommend her mother and half-sister,who was diagnosed with breast cancer have genetic counseling and updated testing.  We do not recommend familial testing for the TERT variant of uncertain significance (VUS).  FOLLOW-UP:  Cancer genetics is a rapidly advancing field and it is possible that new genetic tests will be appropriate for her and/or her  family members in the future. We encouraged her to remain in contact with cancer genetics on an annual basis so we can update her personal and family histories and let her know of advances in cancer genetics that may benefit this family.   Our contact number was provided. Ms. Hoffert questions were answered to her satisfaction, and she knows she is welcome to call us at anytime with additional questions or concerns.   Lalla Brothers, MS, [facility] Genetic Counselor [email or web address] (P) [phone]

## 2021-07-27 ENCOUNTER — Ambulatory Visit
Admission: RE | Admit: 2021-07-27 | Discharge: 2021-07-27 | Disposition: A | Discharge status: Home / Self Care | Payer: BC Managed Care – PPO | Source: Ambulatory Visit | Attending: Obstetrics and Gynecology | Admitting: Obstetrics and Gynecology

## 2021-07-27 DIAGNOSIS — R922 Inconclusive mammogram: Secondary | ICD-10-CM

## 2021-07-27 DIAGNOSIS — Z803 Family history of malignant neoplasm of breast: Secondary | ICD-10-CM

## 2021-07-27 MED ORDER — GADOBUTROL 1 MMOL/ML IV SOLN
8.0000 mL | Freq: Once | INTRAVENOUS | Status: AC | PRN
Start: 2021-07-27 — End: 2021-07-27
  Administered 2021-07-27: 8 mL via INTRAVENOUS

## 2022-01-25 ENCOUNTER — Other Ambulatory Visit: Payer: Self-pay | Admitting: Obstetrics and Gynecology

## 2022-01-25 DIAGNOSIS — Z1231 Encounter for screening mammogram for malignant neoplasm of breast: Secondary | ICD-10-CM

## 2022-03-16 ENCOUNTER — Ambulatory Visit
Admission: RE | Admit: 2022-03-16 | Discharge: 2022-03-16 | Disposition: A | Discharge status: Home / Self Care | Payer: BC Managed Care – PPO | Source: Ambulatory Visit | Attending: Obstetrics and Gynecology | Admitting: Obstetrics and Gynecology

## 2022-03-16 DIAGNOSIS — Z1231 Encounter for screening mammogram for malignant neoplasm of breast: Secondary | ICD-10-CM

## 2023-03-03 ENCOUNTER — Other Ambulatory Visit: Payer: Self-pay | Admitting: Internal Medicine

## 2023-03-03 DIAGNOSIS — Z1231 Encounter for screening mammogram for malignant neoplasm of breast: Secondary | ICD-10-CM

## 2023-03-23 ENCOUNTER — Ambulatory Visit
Admission: RE | Admit: 2023-03-23 | Discharge: 2023-03-23 | Disposition: A | Discharge status: Home / Self Care | Payer: BC Managed Care – PPO | Source: Ambulatory Visit | Attending: Internal Medicine | Admitting: Internal Medicine

## 2023-03-23 DIAGNOSIS — Z1231 Encounter for screening mammogram for malignant neoplasm of breast: Secondary | ICD-10-CM

## 2024-01-24 ENCOUNTER — Other Ambulatory Visit: Payer: Self-pay | Admitting: Internal Medicine

## 2024-01-24 DIAGNOSIS — Z1231 Encounter for screening mammogram for malignant neoplasm of breast: Secondary | ICD-10-CM

## 2024-03-27 ENCOUNTER — Ambulatory Visit
